# Patient Record
Sex: Male | Born: 1950 | Race: White | Hispanic: No | Marital: Married | Smoking: Never smoker
Health system: Southern US, Community
[De-identification: ages and names within clinical notes are randomized; demographics above are authoritative.]

## PROBLEM LIST (undated history)

## (undated) DIAGNOSIS — I10 Essential (primary) hypertension: Secondary | ICD-10-CM

## (undated) DIAGNOSIS — M1990 Unspecified osteoarthritis, unspecified site: Secondary | ICD-10-CM

## (undated) DIAGNOSIS — F329 Major depressive disorder, single episode, unspecified: Secondary | ICD-10-CM

## (undated) DIAGNOSIS — F419 Anxiety disorder, unspecified: Secondary | ICD-10-CM

## (undated) DIAGNOSIS — K219 Gastro-esophageal reflux disease without esophagitis: Secondary | ICD-10-CM

## (undated) DIAGNOSIS — E785 Hyperlipidemia, unspecified: Secondary | ICD-10-CM

## (undated) DIAGNOSIS — R519 Headache, unspecified: Secondary | ICD-10-CM

## (undated) DIAGNOSIS — F32A Depression, unspecified: Secondary | ICD-10-CM

## (undated) DIAGNOSIS — Z87442 Personal history of urinary calculi: Secondary | ICD-10-CM

## (undated) DIAGNOSIS — R51 Headache: Secondary | ICD-10-CM

## (undated) HISTORY — DX: Unspecified osteoarthritis, unspecified site: M19.90

## (undated) HISTORY — DX: Hyperlipidemia, unspecified: E78.5

## (undated) HISTORY — PX: TONSILLECTOMY: SUR1361

---

## 1996-11-24 HISTORY — PX: HERNIA REPAIR: SHX51

## 2004-07-24 ENCOUNTER — Emergency Department (HOSPITAL_COMMUNITY): Admission: EM | Admit: 2004-07-24 | Discharge: 2004-07-24 | Payer: Self-pay | Admitting: Emergency Medicine

## 2007-07-22 ENCOUNTER — Emergency Department (HOSPITAL_COMMUNITY): Admission: EM | Admit: 2007-07-22 | Discharge: 2007-07-22 | Payer: Self-pay | Admitting: Emergency Medicine

## 2009-07-31 ENCOUNTER — Emergency Department (HOSPITAL_COMMUNITY): Admission: EM | Admit: 2009-07-31 | Discharge: 2009-08-01 | Payer: Self-pay | Admitting: Emergency Medicine

## 2009-08-20 ENCOUNTER — Ambulatory Visit (HOSPITAL_COMMUNITY): Admission: RE | Admit: 2009-08-20 | Discharge: 2009-08-20 | Payer: Self-pay | Admitting: Urology

## 2011-02-28 LAB — DIFFERENTIAL
Basophils Absolute: 0 10*3/uL (ref 0.0–0.1)
Eosinophils Absolute: 0.2 10*3/uL (ref 0.0–0.7)
Lymphs Abs: 1.8 10*3/uL (ref 0.7–4.0)
Neutrophils Relative %: 63 % (ref 43–77)

## 2011-02-28 LAB — URINALYSIS, ROUTINE W REFLEX MICROSCOPIC
Bilirubin Urine: NEGATIVE
Specific Gravity, Urine: 1.022 (ref 1.005–1.030)
pH: 6.5 (ref 5.0–8.0)

## 2011-02-28 LAB — URINE CULTURE: Colony Count: NO GROWTH

## 2011-02-28 LAB — CBC
HCT: 42.4 % (ref 39.0–52.0)
MCHC: 34.1 g/dL (ref 30.0–36.0)
MCV: 93.6 fL (ref 78.0–100.0)
Platelets: 199 10*3/uL (ref 150–400)
WBC: 7.1 10*3/uL (ref 4.0–10.5)

## 2011-02-28 LAB — POCT I-STAT, CHEM 8
BUN: 28 mg/dL — ABNORMAL HIGH (ref 6–23)
Chloride: 102 mEq/L (ref 96–112)
Creatinine, Ser: 0.8 mg/dL (ref 0.4–1.5)
Glucose, Bld: 120 mg/dL — ABNORMAL HIGH (ref 70–99)
HCT: 42 % (ref 39.0–52.0)
Sodium: 139 mEq/L (ref 135–145)
TCO2: 33 mmol/L (ref 0–100)

## 2011-02-28 LAB — URINE MICROSCOPIC-ADD ON

## 2014-05-05 ENCOUNTER — Encounter (INDEPENDENT_AMBULATORY_CARE_PROVIDER_SITE_OTHER): Payer: BC Managed Care – PPO | Admitting: Ophthalmology

## 2014-05-05 DIAGNOSIS — H33309 Unspecified retinal break, unspecified eye: Secondary | ICD-10-CM

## 2014-05-05 DIAGNOSIS — H43819 Vitreous degeneration, unspecified eye: Secondary | ICD-10-CM

## 2014-05-05 DIAGNOSIS — I10 Essential (primary) hypertension: Secondary | ICD-10-CM

## 2014-05-05 DIAGNOSIS — H2510 Age-related nuclear cataract, unspecified eye: Secondary | ICD-10-CM

## 2014-05-05 DIAGNOSIS — H35039 Hypertensive retinopathy, unspecified eye: Secondary | ICD-10-CM

## 2014-05-17 ENCOUNTER — Ambulatory Visit (INDEPENDENT_AMBULATORY_CARE_PROVIDER_SITE_OTHER): Payer: BC Managed Care – PPO | Admitting: Ophthalmology

## 2014-05-17 DIAGNOSIS — H33309 Unspecified retinal break, unspecified eye: Secondary | ICD-10-CM

## 2014-09-12 ENCOUNTER — Encounter (INDEPENDENT_AMBULATORY_CARE_PROVIDER_SITE_OTHER): Payer: BC Managed Care – PPO | Admitting: Ophthalmology

## 2014-09-12 DIAGNOSIS — H35033 Hypertensive retinopathy, bilateral: Secondary | ICD-10-CM

## 2014-09-12 DIAGNOSIS — H35371 Puckering of macula, right eye: Secondary | ICD-10-CM

## 2014-09-12 DIAGNOSIS — H43813 Vitreous degeneration, bilateral: Secondary | ICD-10-CM

## 2014-09-12 DIAGNOSIS — I10 Essential (primary) hypertension: Secondary | ICD-10-CM

## 2014-09-12 DIAGNOSIS — H33301 Unspecified retinal break, right eye: Secondary | ICD-10-CM

## 2015-08-30 ENCOUNTER — Other Ambulatory Visit: Payer: Self-pay | Admitting: Urology

## 2015-09-05 ENCOUNTER — Encounter (HOSPITAL_COMMUNITY): Payer: Self-pay | Admitting: *Deleted

## 2015-09-06 ENCOUNTER — Ambulatory Visit (HOSPITAL_COMMUNITY)
Admission: RE | Admit: 2015-09-06 | Discharge: 2015-09-06 | Disposition: A | Discharge status: Home / Self Care | Payer: 59 | Source: Ambulatory Visit | Attending: Urology | Admitting: Urology

## 2015-09-06 ENCOUNTER — Encounter (HOSPITAL_COMMUNITY)
Admission: RE | Disposition: A | Discharge status: Home / Self Care | Payer: Self-pay | Source: Ambulatory Visit | Attending: Urology

## 2015-09-06 ENCOUNTER — Ambulatory Visit (HOSPITAL_COMMUNITY): Payer: 59

## 2015-09-06 ENCOUNTER — Encounter (HOSPITAL_COMMUNITY): Payer: Self-pay | Admitting: General Practice

## 2015-09-06 ENCOUNTER — Emergency Department (HOSPITAL_COMMUNITY): Payer: 59

## 2015-09-06 ENCOUNTER — Emergency Department (HOSPITAL_COMMUNITY)
Admission: EM | Admit: 2015-09-06 | Discharge: 2015-09-07 | Disposition: A | Discharge status: Home / Self Care | Payer: 59 | Attending: Emergency Medicine | Admitting: Emergency Medicine

## 2015-09-06 ENCOUNTER — Encounter (HOSPITAL_COMMUNITY): Payer: Self-pay | Admitting: Emergency Medicine

## 2015-09-06 DIAGNOSIS — K219 Gastro-esophageal reflux disease without esophagitis: Secondary | ICD-10-CM | POA: Insufficient documentation

## 2015-09-06 DIAGNOSIS — I10 Essential (primary) hypertension: Secondary | ICD-10-CM | POA: Diagnosis not present

## 2015-09-06 DIAGNOSIS — N3289 Other specified disorders of bladder: Secondary | ICD-10-CM | POA: Diagnosis not present

## 2015-09-06 DIAGNOSIS — F329 Major depressive disorder, single episode, unspecified: Secondary | ICD-10-CM | POA: Insufficient documentation

## 2015-09-06 DIAGNOSIS — R109 Unspecified abdominal pain: Secondary | ICD-10-CM | POA: Diagnosis not present

## 2015-09-06 DIAGNOSIS — R112 Nausea with vomiting, unspecified: Secondary | ICD-10-CM | POA: Diagnosis present

## 2015-09-06 DIAGNOSIS — Z79899 Other long term (current) drug therapy: Secondary | ICD-10-CM | POA: Diagnosis not present

## 2015-09-06 DIAGNOSIS — N201 Calculus of ureter: Secondary | ICD-10-CM

## 2015-09-06 HISTORY — DX: Personal history of urinary calculi: Z87.442

## 2015-09-06 HISTORY — DX: Essential (primary) hypertension: I10

## 2015-09-06 HISTORY — DX: Headache: R51

## 2015-09-06 HISTORY — DX: Headache, unspecified: R51.9

## 2015-09-06 HISTORY — DX: Gastro-esophageal reflux disease without esophagitis: K21.9

## 2015-09-06 HISTORY — DX: Anxiety disorder, unspecified: F41.9

## 2015-09-06 HISTORY — DX: Depression, unspecified: F32.A

## 2015-09-06 HISTORY — DX: Major depressive disorder, single episode, unspecified: F32.9

## 2015-09-06 LAB — URINALYSIS, ROUTINE W REFLEX MICROSCOPIC
Bilirubin Urine: NEGATIVE
Glucose, UA: NEGATIVE mg/dL
Ketones, ur: 15 mg/dL — AB
Leukocytes, UA: NEGATIVE
NITRITE: NEGATIVE
PROTEIN: NEGATIVE mg/dL
SPECIFIC GRAVITY, URINE: 1.023 (ref 1.005–1.030)
UROBILINOGEN UA: 0.2 mg/dL (ref 0.0–1.0)
pH: 5.5 (ref 5.0–8.0)

## 2015-09-06 LAB — CBC WITH DIFFERENTIAL/PLATELET
Basophils Absolute: 0 10*3/uL (ref 0.0–0.1)
Basophils Relative: 0 %
Eosinophils Absolute: 0 10*3/uL (ref 0.0–0.7)
Eosinophils Relative: 0 %
HEMATOCRIT: 43.9 % (ref 39.0–52.0)
HEMOGLOBIN: 15.2 g/dL (ref 13.0–17.0)
LYMPHS ABS: 0.9 10*3/uL (ref 0.7–4.0)
LYMPHS PCT: 6 %
MCH: 31.5 pg (ref 26.0–34.0)
MCHC: 34.6 g/dL (ref 30.0–36.0)
MCV: 90.9 fL (ref 78.0–100.0)
MONOS PCT: 3 %
Monocytes Absolute: 0.4 10*3/uL (ref 0.1–1.0)
NEUTROS PCT: 91 %
Neutro Abs: 12.3 10*3/uL — ABNORMAL HIGH (ref 1.7–7.7)
Platelets: 176 10*3/uL (ref 150–400)
RBC: 4.83 MIL/uL (ref 4.22–5.81)
RDW: 12.5 % (ref 11.5–15.5)
WBC: 13.6 10*3/uL — AB (ref 4.0–10.5)

## 2015-09-06 LAB — COMPREHENSIVE METABOLIC PANEL
ALBUMIN: 4.4 g/dL (ref 3.5–5.0)
ALK PHOS: 67 U/L (ref 38–126)
ALT: 28 U/L (ref 17–63)
ANION GAP: 8 (ref 5–15)
AST: 23 U/L (ref 15–41)
BUN: 18 mg/dL (ref 6–20)
CHLORIDE: 100 mmol/L — AB (ref 101–111)
CO2: 28 mmol/L (ref 22–32)
CREATININE: 0.9 mg/dL (ref 0.61–1.24)
Calcium: 9.1 mg/dL (ref 8.9–10.3)
GFR calc non Af Amer: >60 mL/min (ref >60)
GLUCOSE: 146 mg/dL — AB (ref 65–99)
Potassium: 4.1 mmol/L (ref 3.5–5.1)
SODIUM: 136 mmol/L (ref 135–145)
Total Bilirubin: 1.2 mg/dL (ref 0.3–1.2)
Total Protein: 7.3 g/dL (ref 6.5–8.1)

## 2015-09-06 LAB — LIPASE, BLOOD: Lipase: 29 U/L (ref 22–51)

## 2015-09-06 LAB — URINE MICROSCOPIC-ADD ON

## 2015-09-06 SURGERY — LITHOTRIPSY, ESWL
Anesthesia: LOCAL | Laterality: Left

## 2015-09-06 MED ORDER — KETOROLAC TROMETHAMINE 30 MG/ML IJ SOLN
30.0000 mg | Freq: Once | INTRAMUSCULAR | Status: AC
  Administered 2015-09-07: 30 mg via INTRAVENOUS
  Filled 2015-09-06: qty 1

## 2015-09-06 MED ORDER — ONDANSETRON HCL 4 MG/2ML IJ SOLN
4.0000 mg | Freq: Once | INTRAMUSCULAR | Status: AC | PRN
  Administered 2015-09-06: 4 mg via INTRAVENOUS
  Filled 2015-09-06: qty 2

## 2015-09-06 MED ORDER — HYDROMORPHONE HCL 1 MG/ML IJ SOLN
1.0000 mg | Freq: Once | INTRAMUSCULAR | Status: AC
  Administered 2015-09-06: 1 mg via INTRAVENOUS
  Filled 2015-09-06: qty 1

## 2015-09-06 MED ORDER — SODIUM CHLORIDE 0.9 % IV SOLN
INTRAVENOUS | Status: DC
  Administered 2015-09-06: 08:00:00 via INTRAVENOUS

## 2015-09-06 MED ORDER — DIAZEPAM 5 MG PO TABS
10.0000 mg | ORAL_TABLET | ORAL | Status: AC
  Administered 2015-09-06: 10 mg via ORAL
  Filled 2015-09-06: qty 2

## 2015-09-06 MED ORDER — FENTANYL CITRATE (PF) 100 MCG/2ML IJ SOLN
50.0000 ug | Freq: Once | INTRAMUSCULAR | Status: AC
  Administered 2015-09-06: 50 ug via INTRAVENOUS
  Filled 2015-09-06: qty 2

## 2015-09-06 MED ORDER — DIPHENHYDRAMINE HCL 25 MG PO CAPS
25.0000 mg | ORAL_CAPSULE | ORAL | Status: AC
  Administered 2015-09-06: 25 mg via ORAL
  Filled 2015-09-06: qty 1

## 2015-09-06 MED ORDER — CIPROFLOXACIN HCL 500 MG PO TABS
500.0000 mg | ORAL_TABLET | ORAL | Status: AC
  Administered 2015-09-06: 500 mg via ORAL
  Filled 2015-09-06: qty 1

## 2015-09-06 MED ORDER — SODIUM CHLORIDE 0.9 % IV BOLUS (SEPSIS)
1000.0000 mL | Freq: Once | INTRAVENOUS | Status: AC
  Administered 2015-09-06: 1000 mL via INTRAVENOUS

## 2015-09-06 NOTE — Discharge Instructions (Signed)
Kidney Stones °Kidney stones (urolithiasis) are deposits that form inside your kidneys. The intense pain is caused by the stone moving through the urinary tract. When the stone moves, the ureter goes into spasm around the stone. The stone is usually passed in the urine.  °CAUSES  °· A disorder that makes certain neck glands produce too much parathyroid hormone (primary hyperparathyroidism). °· A buildup of uric acid crystals, similar to gout in your joints. °· Narrowing (stricture) of the ureter. °· A kidney obstruction present at birth (congenital obstruction). °· Previous surgery on the kidney or ureters. °· Numerous kidney infections. °SYMPTOMS  °· Feeling sick to your stomach (nauseous). °· Throwing up (vomiting). °· Blood in the urine (hematuria). °· Pain that usually spreads (radiates) to the groin. °· Frequency or urgency of urination. °DIAGNOSIS  °· Taking a history and physical exam. °· Blood or urine tests. °· CT scan. °· Occasionally, an examination of the inside of the urinary bladder (cystoscopy) is performed. °TREATMENT  °· Observation. °· Increasing your fluid intake. °· Extracorporeal shock wave lithotripsy--This is a noninvasive procedure that uses shock waves to break up kidney stones. °· Surgery may be needed if you have severe pain or persistent obstruction. There are various surgical procedures. Most of the procedures are performed with the use of small instruments. Only small incisions are needed to accommodate these instruments, so recovery time is minimized. °The size, location, and chemical composition are all important variables that will determine the proper choice of action for you. Talk to your health care provider to better understand your situation so that you will minimize the risk of injury to yourself and your kidney.  °HOME CARE INSTRUCTIONS  °· Drink enough water and fluids to keep your urine clear or pale yellow. This will help you to pass the stone or stone fragments. °· Strain  all urine through the provided strainer. Keep all particulate matter and stones for your health care provider to see. The stone causing the pain may be as small as a grain of salt. It is very important to use the strainer each and every time you pass your urine. The collection of your stone will allow your health care provider to analyze it and verify that a stone has actually passed. The stone analysis will often identify what you can do to reduce the incidence of recurrences. °· Only take over-the-counter or prescription medicines for pain, discomfort, or fever as directed by your health care provider. °· Keep all follow-up visits as told by your health care provider. This is important. °· Get follow-up X-rays if required. The absence of pain does not always mean that the stone has passed. It may have only stopped moving. If the urine remains completely obstructed, it can cause loss of kidney function or even complete destruction of the kidney. It is your responsibility to make sure X-rays and follow-ups are completed. Ultrasounds of the kidney can show blockages and the status of the kidney. Ultrasounds are not associated with any radiation and can be performed easily in a matter of minutes. °· Make changes to your daily diet as told by your health care provider. You may be told to: °¨ Limit the amount of salt that you eat. °¨ Eat 5 or more servings of fruits and vegetables each day. °¨ Limit the amount of meat, poultry, fish, and eggs that you eat. °· Collect a 24-hour urine sample as told by your health care provider. You may need to collect another urine sample every 6-12   months. °SEEK MEDICAL CARE IF: °· You experience pain that is progressive and unresponsive to any pain medicine you have been prescribed. °SEEK IMMEDIATE MEDICAL CARE IF:  °· Pain cannot be controlled with the prescribed medicine. °· You have a fever or shaking chills. °· The severity or intensity of pain increases over 18 hours and is not  relieved by pain medicine. °· You develop a new onset of abdominal pain. °· You feel faint or pass out. °· You are unable to urinate. °  °This information is not intended to replace advice given to you by your health care provider. Make sure you discuss any questions you have with your health care provider. °  °Document Released: 11/10/2005 Document Revised: 08/01/2015 Document Reviewed: 04/13/2013 °Elsevier Interactive Patient Education ©2016 [employer] ° °

## 2015-09-06 NOTE — ED Notes (Addendum)
Pt has lithotripsy this morning, now having severe n/v. Pt actively vomiting in lobby.

## 2015-09-07 MED ORDER — ONDANSETRON 4 MG PO TBDP: 4.0000 mg | ORAL_TABLET | Freq: Three times a day (TID) | ORAL | Status: DC | PRN

## 2015-09-07 NOTE — ED Provider Notes (Signed)
CSN: [id number]     Arrival date & time 09/06/15  2108 History   First MD Initiated Contact with Patient 09/06/15 2147     Chief Complaint  Patient presents with  . Emesis  . Nausea     (Consider location/radiation/quality/duration/timing/severity/associated sxs/prior Treatment) HPI Comments: 64yo M w/ PMH including HTN, GERD, kidney stones, anxiety/depression who p/w vomiting and L side pain. Pt had lithotripsy earlier today for chronic left ureteral stone. Procedure was uncomplicated and he was stable when he went home. This evening at 6pm he began having severe L side into LLQ pain w/ multiple episodes of vomiting. He took hydrocodone w/ no relief. He continues to have dark urine; he has had intermittent hematuria with the known stone. No fevers, no chest pain or shortness of breath. He feels like he cannot urinate. He also endorses recent constipation. No diarrhea.   The history is provided by the patient.    Past Medical History  Diagnosis Date  . History of kidney stones 2010, 2006, 2002    cystoscopy with stone retrieval  . Hypertension   . Depression   . Anxiety   . GERD (gastroesophageal reflux disease)   . Headache     sinus headache   Past Surgical History  Procedure Laterality Date  . Hernia repair Bilateral 1998  . Tonsillectomy      childhood   History reviewed. No pertinent family history. Social History  Substance Use Topics  . Smoking status: Never Smoker   . Smokeless tobacco: None  . Alcohol Use: No    Review of Systems  10 Systems reviewed and are negative for acute change except as noted in the HPI.   Allergies  Amoxicillin  Home Medications   Prior to Admission medications   Medication Sig Start Date End Date Taking? Authorizing Provider  bisacodyl (DULCOLAX) 5 MG EC tablet Take 5 mg by mouth once.   Yes Historical Provider, MD  chlorpheniramine (CHLOR-TRIMETON) 4 MG tablet Take 4 mg by mouth every morning.   Yes Historical Provider, MD   fluticasone (FLONASE) 50 MCG/ACT nasal spray Place 2 sprays into both nostrils daily.   Yes Historical Provider, MD  lisinopril-hydrochlorothiazide (PRINZIDE,ZESTORETIC) 20-12.5 MG tablet Take 1 tablet by mouth every morning.   Yes Historical Provider, MD  oxyCODONE-acetaminophen (PERCOCET/ROXICET) 5-325 MG tablet Take 1 tablet by mouth every 4 (four) hours as needed for severe pain.   Yes Historical Provider, MD  PARoxetine (PAXIL) 20 MG tablet Take 20 mg by mouth every morning.   Yes Historical Provider, MD  pravastatin (PRAVACHOL) 40 MG tablet Take 40 mg by mouth every morning.   Yes Historical Provider, MD  tamsulosin (FLOMAX) 0.4 MG CAPS capsule Take 0.4 mg by mouth every morning.   Yes Historical Provider, MD  ondansetron (ZOFRAN ODT) 4 MG disintegrating tablet Take 1 tablet (4 mg total) by mouth every 8 (eight) hours as needed for nausea or vomiting. 09/07/15   Ambrose Finlandachel Morgan Noell Shular, MD   BP 119/74 mmHg  Pulse 100  Temp(Src) 98.3 F (36.8 C) (Oral)  Resp 18  SpO2 96% Physical Exam  Constitutional: He is oriented to person, place, and time. He appears well-developed and well-nourished. No distress.  Holding left side, sitting forward, in moderate distress due to pain  HENT:  Head: Normocephalic and atraumatic.  Moist mucous membranes  Eyes: Conjunctivae are normal. Pupils are equal, round, and reactive to light.  Neck: Neck supple.  Cardiovascular: Normal rate, regular rhythm and normal heart sounds.  No murmur heard. Pulmonary/Chest: Effort normal and breath sounds normal.  Abdominal: Soft. Bowel sounds are normal. He exhibits no distension.  Mild left periumbilical and LLQ tenderness, no rebound or guarding  Genitourinary:  No CVA tenderness  Musculoskeletal: He exhibits no edema.  Neurological: He is alert and oriented to person, place, and time.  Fluent speech  Skin: Skin is warm and dry.  Psychiatric: He has a normal mood and affect. Judgment normal.  distressed   Nursing note and vitals reviewed.   ED Course  Procedures (including critical care time) Labs Review Labs Reviewed  URINALYSIS, ROUTINE W REFLEX MICROSCOPIC (NOT AT [facility]) - Abnormal; Notable for the following:    APPearance CLOUDY (*)    Hgb urine dipstick LARGE (*)    Ketones, ur 15 (*)    All other components within normal limits  COMPREHENSIVE METABOLIC PANEL - Abnormal; Notable for the following:    Chloride 100 (*)    Glucose, Bld 146 (*)    All other components within normal limits  CBC WITH DIFFERENTIAL/PLATELET - Abnormal; Notable for the following:    WBC 13.6 (*)    Neutro Abs 12.3 (*)    All other components within normal limits  LIPASE, BLOOD  URINE MICROSCOPIC-ADD ON    Imaging Review No results found. I have personally reviewed and evaluated these images and lab results as part of my medical decision-making.   EKG Interpretation None     Medications  ondansetron (ZOFRAN) injection 4 mg (4 mg Intravenous Given 09/06/15 2206)  fentaNYL (SUBLIMAZE) injection 50 mcg (50 mcg Intravenous Given 09/06/15 2206)  sodium chloride 0.9 % bolus 1,000 mL (0 mLs Intravenous Stopped 09/06/15 2311)  HYDROmorphone (DILAUDID) injection 1 mg (1 mg Intravenous Given 09/06/15 2209)  HYDROmorphone (DILAUDID) injection 1 mg (1 mg Intravenous Given 09/06/15 2309)  ketorolac (TORADOL) 30 MG/ML injection 30 mg (30 mg Intravenous Given 09/07/15 0004)    MDM   Final diagnoses:  Left flank pain  Bladder spasm   64yo M who p/w severe L side pain and vomiting after lithotripsy for L ureteral stone earlier today. Pt in moderate distress at presentation. Mild LLQ tenderness but no peritonitis on exam. VS stable. Obtained labs which showed no evidence of infection on UA and normal Cr at 0.9. KUB showed no evidence of stone which was identified before lithotripsy. Gave pt zofran, dilaudid, and later toradol for pain. Spoke with urologist on call and appreciate his assistance. He stated that  these symptoms occasionally happen after break-up of a large stone and he recommended pain and nausea control w/ close f/u with clinic. On reexamination, pt is comfortable and is tolerating water. He has been able to urinate in ED. Provided w/ percocet and supportive care instructions. Return precautions reviewed and pt instructed to call clinic in the morning if any issues. Pt comfortable with plan and discharged in satisfactory condition.    Laurence Spates, MD 09/09/15 2111

## 2016-07-02 ENCOUNTER — Ambulatory Visit: Payer: 59 | Admitting: Neurology

## 2016-08-04 ENCOUNTER — Encounter: Payer: Self-pay | Admitting: Neurology

## 2016-08-04 ENCOUNTER — Ambulatory Visit (INDEPENDENT_AMBULATORY_CARE_PROVIDER_SITE_OTHER): Payer: 59 | Admitting: Neurology

## 2016-08-04 VITALS — BP 130/77 | HR 76 | Ht 69.5 in | Wt 184.8 lb

## 2016-08-04 DIAGNOSIS — M545 Low back pain, unspecified: Secondary | ICD-10-CM | POA: Insufficient documentation

## 2016-08-04 DIAGNOSIS — M25559 Pain in unspecified hip: Secondary | ICD-10-CM

## 2016-08-04 DIAGNOSIS — E538 Deficiency of other specified B group vitamins: Secondary | ICD-10-CM

## 2016-08-04 DIAGNOSIS — M5441 Lumbago with sciatica, right side: Secondary | ICD-10-CM | POA: Diagnosis not present

## 2016-08-04 DIAGNOSIS — R29898 Other symptoms and signs involving the musculoskeletal system: Secondary | ICD-10-CM | POA: Diagnosis not present

## 2016-08-04 DIAGNOSIS — M5442 Lumbago with sciatica, left side: Secondary | ICD-10-CM | POA: Diagnosis not present

## 2016-08-04 DIAGNOSIS — M6281 Muscle weakness (generalized): Secondary | ICD-10-CM

## 2016-08-04 DIAGNOSIS — M4800 Spinal stenosis, site unspecified: Secondary | ICD-10-CM | POA: Diagnosis not present

## 2016-08-04 DIAGNOSIS — G629 Polyneuropathy, unspecified: Secondary | ICD-10-CM | POA: Diagnosis not present

## 2016-08-04 DIAGNOSIS — W57XXXA Bitten or stung by nonvenomous insect and other nonvenomous arthropods, initial encounter: Secondary | ICD-10-CM

## 2016-08-04 DIAGNOSIS — M5416 Radiculopathy, lumbar region: Secondary | ICD-10-CM

## 2016-08-04 NOTE — Progress Notes (Signed)
[facility]    Provider:  Dr Lucia Gaskins Referring Provider: Jolene Provost, MD Primary Care Physician:  Jolene Provost, MD  CC:  Leg weakness  HPI:  Ricardo Carter is a 65 y.o. male here as a referral from Dr. William Hamburger for weakness in the legs. Past medical history hypertension, high cholesterol, anxiety, impaired Glucose, weakness of both lower extremities hypercholesterolemia, recurrent major depression, hip pain, hip pain started a year ago and the last 6 months more leg weakness after being in the process of restoring a home and he had pulmonary issues and bronchitis in the spring and march and at the same time he noticed leg weakness. The right hips is worse than the left. He is favoring the elevator at work. He does have low back pain all his life an injury when he was 20 and had bad muscle spasms and he learned over the years to [facility] that, his mother has spinal stenosis. He has radiating pain down the sides of his hips through his calfs down to the toes. Standing for long periods makes it worse. Legs get heavy. They have not done xrays of the hips. He has not mentioned the hip pain to Dr. William Hamburger which is why no xrays done yet. Weakness and heaviness int he legs when he is sitting or laying down, helps to move around if sitting for a long time, noticed numbness in the buttocks and the feet as well. He is having difficulty getting out of low seats. No weakness in the arms when holding overhead. He has some neck pain and tightness for years from bad posture and some radiation into the right arm. Leg weakness is not getting any better.Has radicular symptoms into the bilateral legs.  He likes to bend over the cart in the grocery store when walking. Right worse than the left. He has cold feet for years as well. He has neck pain and right-sided radicular symptoms. No other associated symptoms or modifying factors. Symptoms in the legs continuous.   Reviewed notes, labs and imaging from  outside physicians, which showed:  CT cervical spine: Findings:  The alignment of the cervical spine including the cervicothoracic junction is normal.  Negative for fracture or dislocation.  Small amount of density in the right apex could represent atelectasis.  Patient has multilevel degenerative changes with disc space loss at C3-C4 with  posterior spurring at this level.  Prevertebral soft tissues are normal.  Small scattered lymph nodes throughout the neck.  IMPRESSION: Degenerative changes of the cervical spine without acute bone abnormality.  BUN 18, creatinine 0.9 09/06/2015  Review of Systems: Patient complains of symptoms per HPI as well as the following symptoms: Fatigue, snoring, ringing in ears, joint pain, allergies, headache, numbness, weakness, snoring, anxiety, not in sleep, decreased energy, allergies. Pertinent negatives per HPI. All others negative.   Social History   Social History  . Marital status: Married    Spouse name: Lurena Joiner  . Number of children: 3  . Years of education: Associates   Occupational History  . [employer] and [employer]     Social History Main Topics  . Smoking status: Never Smoker  . Smokeless tobacco: Never Used  . Alcohol use No  . Drug use: No  . Sexual activity: Not on file   Other Topics Concern  . Not on file   Social History Narrative   Lives with wife   Caffeine use: 3 cups per day    Family History  Problem  Relation Age of Onset  . Hypertension Mother   . Diabetes Sister     Past Medical History:  Diagnosis Date  . Anxiety   . Depression   . GERD (gastroesophageal reflux disease)   . Headache    sinus headache  . History of kidney stones 2010, 2006, 2002   cystoscopy with stone retrieval  . Hypertension     Past Surgical History:  Procedure Laterality Date  . HERNIA REPAIR Bilateral 1998  . TONSILLECTOMY     childhood    Current Outpatient Prescriptions  Medication Sig Dispense Refill  . fluticasone  (FLONASE) 50 MCG/ACT nasal spray Place 2 sprays into both nostrils daily.    Marland Kitchen lisinopril-hydrochlorothiazide (PRINZIDE,ZESTORETIC) 20-12.5 MG tablet Take 1 tablet by mouth every morning.    . loratadine (CLARITIN) 10 MG tablet Take 10 mg by mouth daily.    Marland Kitchen PARoxetine (PAXIL) 20 MG tablet Take 20 mg by mouth every morning.    . pravastatin (PRAVACHOL) 40 MG tablet Take 40 mg by mouth every morning.    . tamsulosin (FLOMAX) 0.4 MG CAPS capsule Take 0.4 mg by mouth every morning.     No current facility-administered medications for this visit.     Allergies as of 08/04/2016 - Review Complete 08/04/2016  Allergen Reaction Noted  . Amoxicillin Nausea And Vomiting 08/31/2015    Vitals: BP 130/77 (BP Location: Left Arm, Patient Position: Sitting, Cuff Size: Normal)   Pulse 76   Ht 5' 9.5" (1.765 m)   Wt 184 lb 12.8 oz (83.8 kg)   BMI 26.90 kg/m  Last Weight:  Wt Readings from Last 1 Encounters:  08/04/16 184 lb 12.8 oz (83.8 kg)   Last Height:   Ht Readings from Last 1 Encounters:  08/04/16 5' 9.5" (1.765 m)    Physical exam: Exam: Gen: NAD                 CV: RRR, no MRG. No Carotid Bruits. No peripheral edema, warm, nontender Eyes: Conjunctivae clear without exudates or hemorrhage  Neuro: Detailed Neurologic Exam  Speech:    Speech is normal; fluent and spontaneous with normal comprehension.  Cognition:    The patient is oriented to person, place, and time;     recent and remote memory intact;     language fluent;     normal attention, concentration,     fund of knowledge Cranial Nerves:    The pupils are equal, round, and reactive to light. The fundi are normal and spontaneous venous pulsations are present. Visual fields are full to finger confrontation. Extraocular movements are intact. Trigeminal sensation is intact and the muscles of mastication are normal. The face is symmetric. The palate elevates in the midline. Hearing intact. Voice is normal. Shoulder shrug is  normal. The tongue has normal motion without fasciculations.   Coordination:    Normal finger to nose and heel to shin. Normal rapid alternating movements.   Gait:    Heel-toe and tandem gait are normal.   Motor Observation:    No asymmetry, no atrophy, and no involuntary movements noted. Tone:    Normal muscle tone.    Posture:    Posture is normal. normal erect    Strength:    Strength is V/V in the upper and lower limbs.      Sensation: intact to LT, pin prick, vibration, proprioception     Reflex Exam:  DTR's:    Deep tendon reflexes in the upper and lower extremities are  normal bilaterally.   Toes:    The toes are downgoing bilaterally.   Clonus:    Clonus is absent.      Assessment/Plan:  65 year old with hip and back pain and leg weakness.   labwork today including B12, CK, Lyme Low back pain and leg weakness: Emg/ncs bilateral lowers and right arm.  Will also emg the right arm due to his neck pain and radicular arm pain. MRI lumbar spine  Hip pain: xrays of the hips.  PT for low back pain and hip pain , strengthening, stretching, massage manual therapy, .   Cc: Dr. Neysa HotterHaimes  Beatriz Settles, MD  [facility] [address] [place], [place] [id number]  Phone [phone] Fax [phone]

## 2016-08-04 NOTE — Patient Instructions (Signed)
Remember to drink plenty of fluid, eat healthy meals and do not skip any meals. Try to eat protein with a every meal and eat a healthy snack such as fruit or nuts in between meals. Try to keep a regular sleep-wake schedule and try to exercise daily, particularly in the form of walking, 20-30 minutes a day, if you can.   As far as diagnostic testing: Labwork, emg/ncs, mri lumbar spine and physical therapy  I would like to see you back for emg/ncx, sooner if we need to. Please call us with any interim questions, concerns, problems, updates or refill requests.   Our phone number is [phone]. We also have an after hours call service for urgent matters and there is a physician on-call for urgent questions. For any emergencies you know to call 911 or go to the nearest emergency room

## 2016-08-05 LAB — CK: Total CK: 165 U/L (ref 24–204)

## 2016-08-05 LAB — VITAMIN B12: Vitamin B-12: 283 pg/mL (ref 211–946)

## 2016-08-05 LAB — B. BURGDORFI ANTIBODIES: Lyme IgG/IgM Ab: <0.91 {ISR} (ref 0.00–0.90)

## 2016-08-06 ENCOUNTER — Telehealth: Payer: Self-pay | Admitting: *Deleted

## 2016-08-06 NOTE — Telephone Encounter (Signed)
-----   Message from Anson FretAntonia B Ahern, MD sent at 08/05/2016  4:50 PM EDT ----- Labs normal thanks

## 2016-08-06 NOTE — Telephone Encounter (Signed)
Called and spoke to patient about normal labs per Dr Ahern. Pt verbalized understanding.  

## 2016-08-14 ENCOUNTER — Other Ambulatory Visit: Payer: Self-pay | Admitting: Otolaryngology

## 2016-09-03 ENCOUNTER — Other Ambulatory Visit: Payer: 59

## 2016-10-09 ENCOUNTER — Encounter: Payer: 59 | Admitting: Neurology

## 2017-08-24 ENCOUNTER — Other Ambulatory Visit: Payer: Self-pay | Admitting: Urology

## 2019-05-16 ENCOUNTER — Emergency Department (HOSPITAL_COMMUNITY): Payer: BC Managed Care – PPO

## 2019-05-16 ENCOUNTER — Emergency Department (HOSPITAL_COMMUNITY)
Admission: EM | Admit: 2019-05-16 | Discharge: 2019-05-16 | Disposition: A | Discharge status: Home / Self Care | Payer: BC Managed Care – PPO | Attending: Emergency Medicine | Admitting: Emergency Medicine

## 2019-05-16 ENCOUNTER — Other Ambulatory Visit: Payer: Self-pay

## 2019-05-16 ENCOUNTER — Encounter (HOSPITAL_COMMUNITY): Payer: Self-pay | Admitting: Emergency Medicine

## 2019-05-16 DIAGNOSIS — K219 Gastro-esophageal reflux disease without esophagitis: Secondary | ICD-10-CM | POA: Diagnosis not present

## 2019-05-16 DIAGNOSIS — Z79899 Other long term (current) drug therapy: Secondary | ICD-10-CM | POA: Diagnosis not present

## 2019-05-16 DIAGNOSIS — R0789 Other chest pain: Secondary | ICD-10-CM | POA: Diagnosis present

## 2019-05-16 DIAGNOSIS — I10 Essential (primary) hypertension: Secondary | ICD-10-CM | POA: Insufficient documentation

## 2019-05-16 LAB — CBC
HCT: 45.9 % (ref 39.0–52.0)
Hemoglobin: 15.1 g/dL (ref 13.0–17.0)
MCH: 30.8 pg (ref 26.0–34.0)
MCHC: 32.9 g/dL (ref 30.0–36.0)
MCV: 93.7 fL (ref 80.0–100.0)
Platelets: 196 10*3/uL (ref 150–400)
RBC: 4.9 MIL/uL (ref 4.22–5.81)
RDW: 12.5 % (ref 11.5–15.5)
WBC: 8.6 10*3/uL (ref 4.0–10.5)
nRBC: 0 % (ref 0.0–0.2)

## 2019-05-16 LAB — BASIC METABOLIC PANEL
Anion gap: 10 (ref 5–15)
BUN: 20 mg/dL (ref 8–23)
CO2: 23 mmol/L (ref 22–32)
Calcium: 9.5 mg/dL (ref 8.9–10.3)
Chloride: 107 mmol/L (ref 98–111)
Creatinine, Ser: 0.83 mg/dL (ref 0.61–1.24)
GFR calc Af Amer: >60 mL/min (ref >60)
GFR calc non Af Amer: >60 mL/min (ref >60)
Glucose, Bld: 101 mg/dL — ABNORMAL HIGH (ref 70–99)
Potassium: 3.4 mmol/L — ABNORMAL LOW (ref 3.5–5.1)
Sodium: 140 mmol/L (ref 135–145)

## 2019-05-16 LAB — TROPONIN I
Troponin I: <0.03 ng/mL (ref <0.03)
Troponin I: <0.03 ng/mL (ref <0.03)

## 2019-05-16 MED ORDER — OMEPRAZOLE 20 MG PO CPDR: 20.0000 mg | DELAYED_RELEASE_CAPSULE | Freq: Every day | ORAL | 0 refills | Status: AC

## 2019-05-16 MED ORDER — SODIUM CHLORIDE 0.9% FLUSH: 3.0000 mL | Freq: Once | INTRAVENOUS | Status: DC

## 2019-05-16 MED ORDER — POTASSIUM CHLORIDE CRYS ER 20 MEQ PO TBCR
20.0000 meq | EXTENDED_RELEASE_TABLET | Freq: Once | ORAL | Status: AC
  Administered 2019-05-16: 18:00:00 20 meq via ORAL
  Filled 2019-05-16: qty 1

## 2019-05-16 NOTE — ED Provider Notes (Signed)
[facility]ENC[facility] Provider Note   CSN: [id number] Arrival date & time: 05/16/19  1204    History   Chief Complaint Chief Complaint  Patient presents with  . Chest Pain    HPI Ricardo Carter is a 68 y.o. male.     HPI  68 year old male presents with chest pain.  For the last couple days he is been feeling like he is having increased acid reflux.  He typically takes famotidine.  This morning it was worse and he had to sit up and lay in a chair for bed.  Ended up taking 1 of his wife's omeprazole this morning.  At around 11 AM the pain seemed to go away.  He is been pain-free since.  No shortness of breath, vomiting.  He had concomitant pain in his back, all of this feels like prior acid reflux but stronger.  No radiation of the pain.  No severe or sharp pain.  Felt dull.  Past Medical History:  Diagnosis Date  . Anxiety   . Depression   . GERD (gastroesophageal reflux disease)   . Headache    sinus headache  . History of kidney stones 2010, 2006, 2002   cystoscopy with stone retrieval  . Hypertension     Patient Active Problem List   Diagnosis Date Noted  . Lumbar radiculopathy 08/04/2016  . Low back pain 08/04/2016  . Leg weakness 08/04/2016    Past Surgical History:  Procedure Laterality Date  . HERNIA REPAIR Bilateral 1998  . TONSILLECTOMY     childhood        Home Medications    Prior to Admission medications   Medication Sig Start Date End Date Taking? Authorizing Provider  fluticasone (FLONASE) 50 MCG/ACT nasal spray Place 2 sprays into both nostrils daily.    [provider]  lisinopril-hydrochlorothiazide (PRINZIDE,ZESTORETIC) 20-12.5 MG tablet Take 1 tablet by mouth every morning.    [provider]  loratadine (CLARITIN) 10 MG tablet Take 10 mg by mouth daily.    [provider]  omeprazole (PRILOSEC) 20 MG capsule Take 1 capsule (20 mg total) by mouth daily. 05/16/19   Pricilla LovelessGoldston, Eesa Justiss, MD   PARoxetine (PAXIL) 20 MG tablet Take 20 mg by mouth every morning.    [provider]  pravastatin (PRAVACHOL) 40 MG tablet Take 40 mg by mouth every morning.    [provider]  tamsulosin (FLOMAX) 0.4 MG CAPS capsule Take 0.4 mg by mouth every morning.    [provider]    Family History Family History  Problem Relation Age of Onset  . Hypertension Mother   . Diabetes Sister     Social History Social History   Tobacco Use  . Smoking status: Never Smoker  . Smokeless tobacco: Never Used  Substance Use Topics  . Alcohol use: No  . Drug use: No     Allergies   Amoxicillin   Review of Systems Review of Systems  Respiratory: Negative for shortness of breath.   Cardiovascular: Positive for chest pain.  Gastrointestinal: Negative for abdominal pain and vomiting.  Musculoskeletal: Positive for back pain.  All other systems reviewed and are negative.    Physical Exam Updated Vital Signs BP 137/79   Pulse 76   Temp 98.3 F (36.8 C) (Oral)   Resp 15   Ht 5\' 9"  (1.753 m)   Wt 83.9 kg   SpO2 94%   BMI 27.32 kg/m   Physical Exam Vitals  signs and nursing note reviewed.  Constitutional:      Appearance: He is well-developed.  HENT:     Head: Normocephalic and atraumatic.     Right Ear: External ear normal.     Left Ear: External ear normal.     Nose: Nose normal.  Eyes:     General:        Right eye: No discharge.        Left eye: No discharge.  Neck:     Musculoskeletal: Neck supple.  Cardiovascular:     Rate and Rhythm: Normal rate and regular rhythm.     Heart sounds: Normal heart sounds.  Pulmonary:     Effort: Pulmonary effort is normal.     Breath sounds: Normal breath sounds.  Abdominal:     Palpations: Abdomen is soft.     Tenderness: There is no abdominal tenderness.  Skin:    General: Skin is warm and dry.  Neurological:     Mental Status: He is alert.  Psychiatric:        Mood and Affect: Mood is not anxious.       ED Treatments / Results  Labs (all labs ordered are listed, but only abnormal results are displayed) Labs Reviewed  BASIC METABOLIC PANEL - Abnormal; Notable for the following components:      Result Value   Potassium 3.4 (*)    Glucose, Bld 101 (*)    All other components within normal limits  CBC  TROPONIN I  TROPONIN I    EKG EKG Interpretation  Date/Time:  Monday May 16 2019 12:18:12 EDT Ventricular Rate:  91 PR Interval:  156 QRS Duration: 110 QT Interval:  380 QTC Calculation: 467 R Axis:   -52 Text Interpretation:  Normal sinus rhythm Incomplete right bundle branch block Left anterior fascicular block Abnormal ECG no significant change since 2005 Confirmed by Pricilla LovelessGoldston, Yevonne Yokum [phone]([id number]) on 05/16/2019 4:06:00 PM   EKG Interpretation  Date/Time:  Monday May 16 2019 18:08:53 EDT Ventricular Rate:  77 PR Interval:  156 QRS Duration: 110 QT Interval:  407 QTC Calculation: 461 R Axis:   -36 Text Interpretation:  Sinus rhythm Incomplete RBBB and LAFB Abnormal R-wave progression, early transition no significant change since earlier in the day Confirmed by Pricilla LovelessGoldston, Diandra Cimini [phone]([id number]) on 05/16/2019 6:15:17 PM       Radiology Dg Chest 2 View  Result Date: 05/16/2019 CLINICAL DATA:  Chest and back pain. EXAM: CHEST - 2 VIEW COMPARISON:  06/22/2016 FINDINGS: Cardiomediastinal silhouette is normal. Mediastinal contours appear intact. There is no evidence of focal airspace consolidation, pleural effusion or pneumothorax. Osseous structures are without acute abnormality. Soft tissues are grossly normal. IMPRESSION: No active cardiopulmonary disease. Electronically Signed   By: Ted Mcalpineobrinka  Dimitrova M.D.   On: 05/16/2019 12:55    Procedures Procedures (including critical care time)  Medications Ordered in ED Medications  sodium chloride flush (NS) 0.9 % injection 3 mL (has no administration in time range)  potassium chloride SA (K-DUR) CR tablet 20 mEq (20 mEq Oral Given 05/16/19  1809)     Initial Impression / Assessment and Plan / ED Course  I have reviewed the triage vital signs and the nursing notes.  Pertinent labs & imaging results that were available during my care of the patient were reviewed by me and considered in my medical decision making (see chart for details).        Patient's lab work is overall unremarkable including 2 troponins that are negative.  My suspicion is this is GERD as he has had this in the past.  There is no abdominal tenderness.  He did not have any pain radiating to his back and is not hypertensive so I think dissection is pretty unlikely.  I highly doubt PE.  He does have hypertension and an age over 32 so he has some risk factors for coronary disease but I do not think this is ACS.  I discussed that while we have ruled out MI currently, I cannot rule out all coronary disease and he needs to follow-up closely with his PCP.  We discussed strict return precautions.  I will prescribe omeprazole in addition to his famotidine.  Final Clinical Impressions(s) / ED Diagnoses   Final diagnoses:  Atypical chest pain  Gastroesophageal reflux disease, esophagitis presence not specified    ED Discharge Orders         Ordered    omeprazole (PRILOSEC) 20 MG capsule  Daily     05/16/19 1816           Sherwood Gambler, MD 05/16/19 1818

## 2019-05-16 NOTE — ED Triage Notes (Signed)
Patient reports waking up during the night feeling like he had acid reflux, took a pepcid but reports he felt no better once he woke up this am. States he took one of his wife's omeprazole and then reports around 11 am this morning pain had become very uncomfortable radiating to his back. States pain has now eased off and he has minimal pain at this time. Pt a/ox resp e/u, nad. Denies sob, cough, fever chills or body aches.

## 2019-05-16 NOTE — Discharge Instructions (Addendum)
If you develop recurrent, continued, or worsening chest pain, shortness of breath, fever, vomiting, abdominal or back pain, or any other new/concerning symptoms then return to the ER for evaluation.  

## 2019-10-07 ENCOUNTER — Other Ambulatory Visit: Payer: Self-pay

## 2019-10-07 DIAGNOSIS — Z20822 Contact with and (suspected) exposure to covid-19: Secondary | ICD-10-CM

## 2019-10-11 LAB — NOVEL CORONAVIRUS, NAA: SARS-CoV-2, NAA: NOT DETECTED

## 2020-12-12 ENCOUNTER — Other Ambulatory Visit: Payer: Self-pay | Admitting: Internal Medicine

## 2020-12-12 DIAGNOSIS — U071 COVID-19: Secondary | ICD-10-CM

## 2020-12-13 ENCOUNTER — Other Ambulatory Visit: Payer: Self-pay | Admitting: Internal Medicine

## 2020-12-13 DIAGNOSIS — U071 COVID-19: Secondary | ICD-10-CM

## 2021-01-15 ENCOUNTER — Encounter: Payer: Self-pay | Admitting: Gastroenterology

## 2021-01-31 ENCOUNTER — Ambulatory Visit (INDEPENDENT_AMBULATORY_CARE_PROVIDER_SITE_OTHER): Payer: 59 | Admitting: Gastroenterology

## 2021-01-31 ENCOUNTER — Encounter: Payer: Self-pay | Admitting: Gastroenterology

## 2021-01-31 VITALS — BP 130/80 | HR 88 | Ht 68.31 in | Wt 186.5 lb

## 2021-01-31 DIAGNOSIS — R1310 Dysphagia, unspecified: Secondary | ICD-10-CM | POA: Diagnosis not present

## 2021-01-31 NOTE — Progress Notes (Signed)
Reviewed.  Kimberly L. Beavers, MD, MPH  

## 2021-01-31 NOTE — Progress Notes (Signed)
01/31/2021 Ricardo Carter [id number] 1951/06/02   HISTORY OF PRESENT ILLNESS: This is a pleasant 70 year old male who is new to our office.  He was referred here by his PCP, Dr. Cyndia Bent, for evaluation of esophageal dysphagia.  He tells me that he has been having episodes of food getting stuck in his throat intermittently for about maybe the past year or so.  He says it usually occurs about once a month and he has not been able to identify any particular triggers.  Usually when something gets stuck it will usually pass within at least about 5 minutes or so.  He says he usually take some sips of coffee, water, etc. to help it pass.  He takes omeprazole 20 mg daily, which he says controls his acid reflux well.  He denies any abdominal pain.  He denies any weight loss.  Colonoscopy was in February 2015 by Dr. Conley Rolls at which time he was found to have sigmoid diverticulosis and grade 1 internal hemorrhoids with an excellent prep.  Repeat was recommended a 10-year interval (in CareEverywhere).   Past Medical History:  Diagnosis Date  . Anxiety   . Arthritis   . Depression   . GERD (gastroesophageal reflux disease)   . Headache    sinus headache  . History of kidney stones 2010, 2006, 2002   cystoscopy with stone retrieval  . HLD (hyperlipidemia)   . Hypertension    Past Surgical History:  Procedure Laterality Date  . INGUINAL HERNIA REPAIR Bilateral 1998  . TONSILLECTOMY     childhood    reports that he has never smoked. He has never used smokeless tobacco. He reports that he does not drink alcohol and does not use drugs. family history includes Diabetes in his sister; Heart disease in his brother; Hypertension in his mother and sister; Kidney Stones in his daughter and mother. Allergies  Allergen Reactions  . Amoxicillin Nausea And Vomiting          Outpatient Encounter Medications as of 01/31/2021  Medication Sig  . fluticasone (FLONASE) 50 MCG/ACT nasal spray Place 2 sprays  into both nostrils daily.  Marland Kitchen lisinopril-hydrochlorothiazide (PRINZIDE,ZESTORETIC) 20-12.5 MG tablet Take 1 tablet by mouth every morning.  . loratadine (CLARITIN) 10 MG tablet Take 10 mg by mouth daily.  Marland Kitchen omeprazole (PRILOSEC) 20 MG capsule Take 1 capsule (20 mg total) by mouth daily.  Marland Kitchen PARoxetine (PAXIL) 20 MG tablet Take 20 mg by mouth every morning.  . rosuvastatin (CRESTOR) 20 MG tablet Take 20 mg by mouth at bedtime.  . tamsulosin (FLOMAX) 0.4 MG CAPS capsule Take 0.4 mg by mouth every morning.  . TOVIAZ 8 MG TB24 tablet Take 8 mg by mouth daily.  . [DISCONTINUED] pravastatin (PRAVACHOL) 40 MG tablet Take 40 mg by mouth every morning.   No facility-administered encounter medications on file as of 01/31/2021.     REVIEW OF SYSTEMS  : All other systems reviewed and negative except where noted in the History of Present Illness.   PHYSICAL EXAM: BP 130/80 (BP Location: Left Arm, Patient Position: Sitting, Cuff Size: Normal)   Pulse 88   Ht 5' 8.31" (1.735 m) Comment: height measured without shoes  Wt 186 lb 8 oz (84.6 kg)   BMI 28.10 kg/m  General: Well developed white male in no acute distress Head: Normocephalic and atraumatic Eyes:  Sclerae anicteric, conjunctiva pink. Ears: Normal auditory acuity Lungs: Clear throughout to auscultation; no W/R/R. Heart: Regular rate and rhythm; no M/R/G. Abdomen:  Soft, non-distended.  BS present.  Non-tender. Musculoskeletal: Symmetrical with no gross deformities  Skin: No lesions on visible extremities Extremities: No edema  Neurological: Alert oriented x 4, grossly non-focal Psychological:  Alert and cooperative. Normal mood and affect  ASSESSMENT AND PLAN: *Very intermittent/sporadic solid food dysphagia: Occurs about once a month.  It usually passes within 5 minutes or so with the assistance of drinking some water, coffee, etc.  Acid reflux well-controlled on omeprazole 20 mg daily.  No red flag symptoms including weight loss.  We will  start with an esophagram.  This honestly sounds more like esophageal dysmotility rather than a fixed structural issue such as a stricture, mass, etc.   CC:  Eartha Inch, MD

## 2021-01-31 NOTE — Patient Instructions (Signed)
If you are age 70 or older, your body mass index should be between 23-30. Your Body mass index is 28.1 kg/m. If this is out of the aforementioned range listed, please consider follow up with your Primary Care Provider.  If you are age 94 or younger, your body mass index should be between 19-25. Your Body mass index is 28.1 kg/m. If this is out of the aformentioned range listed, please consider follow up with your Primary Care Provider.   IMAGING:  . You will be contacted by [facility] Scheduling (Your caller ID will indicate phone # [phone]) within the next business 2 days to schedule your Barium Esophagram. If you have not heard from them within 2 business days, please call [facility] Scheduling at [phone] to follow up on the status of your appointment.

## 2021-02-12 ENCOUNTER — Other Ambulatory Visit: Payer: Self-pay | Admitting: Gastroenterology

## 2021-02-12 ENCOUNTER — Other Ambulatory Visit: Payer: Self-pay

## 2021-02-12 ENCOUNTER — Ambulatory Visit (HOSPITAL_COMMUNITY)
Admission: RE | Admit: 2021-02-12 | Discharge: 2021-02-12 | Disposition: A | Discharge status: Home / Self Care | Payer: 59 | Source: Ambulatory Visit | Attending: Gastroenterology | Admitting: Gastroenterology

## 2021-02-12 DIAGNOSIS — R1310 Dysphagia, unspecified: Secondary | ICD-10-CM | POA: Insufficient documentation

## 2021-02-27 ENCOUNTER — Ambulatory Visit: Payer: BC Managed Care – PPO | Admitting: Gastroenterology

## 2022-01-11 IMAGING — RF DG ESOPHAGUS
7 of 9 series · 16 of 24 positions shown · non-contrast
Comparison: None.

CLINICAL DATA: Dysphasia

EXAM:
ESOPHOGRAM / BARIUM SWALLOW / BARIUM TABLET STUDY
TECHNIQUE: Combined double contrast and single contrast examination performed
using effervescent crystals, thick barium liquid, and thin barium
liquid. The patient was observed with fluoroscopy swallowing a 13 mm
barium sulphate tablet.
FLUOROSCOPY TIME:  Fluoroscopy Time:  1 minutes 36 seconds
Radiation Exposure Index (if provided by the fluoroscopic device):
19.8 mGy
Number of Acquired Spot Images: 0

[Series 1: cp_standard · 0.25mm/px · 1 of 1 slices shown (1 of 7)]
[im 1/1]
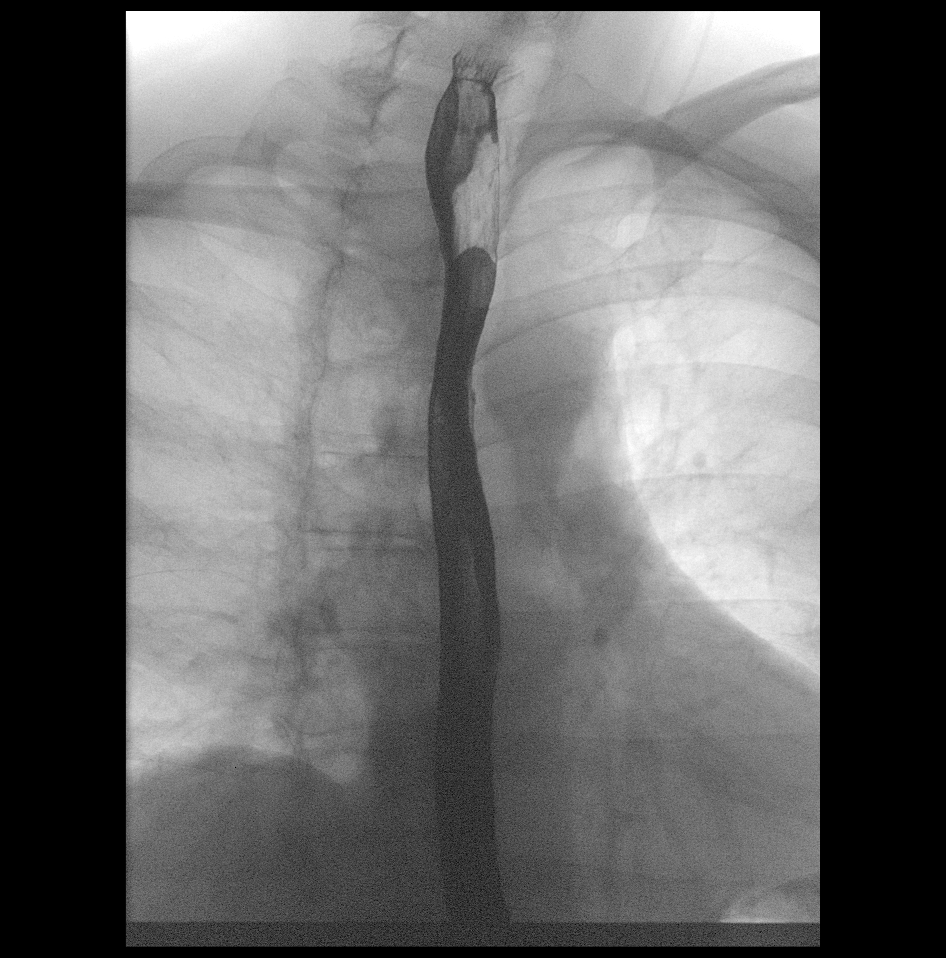

[Series 3: cp_standard · 0.25mm/px · 1 of 1 slices shown (2 of 7)]
[im 1/1]
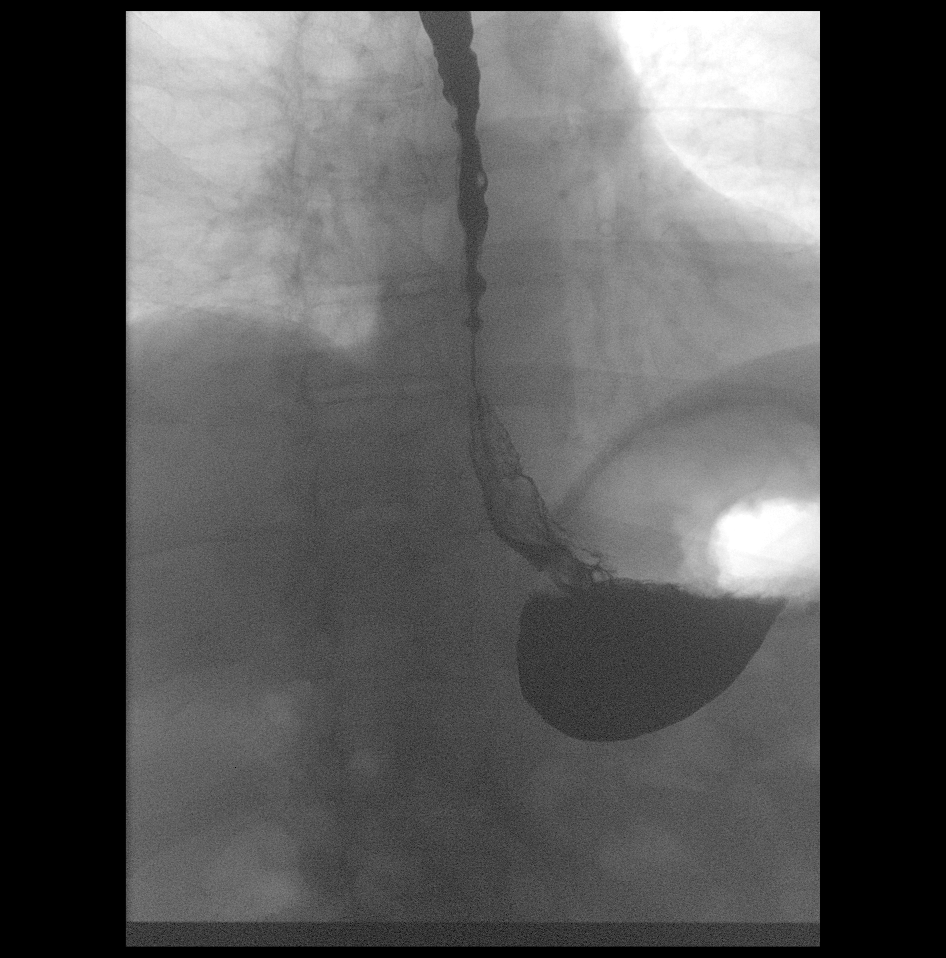

[Series 4: cp_standard · 0.51mm/px · 3 of 151 frames shown (3 of 7)]
[frame 23/151]
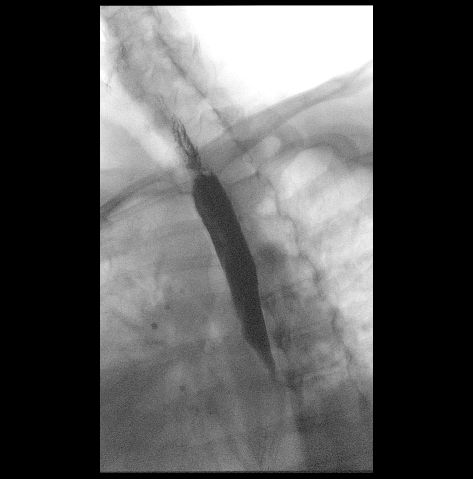
[frame 127/151]
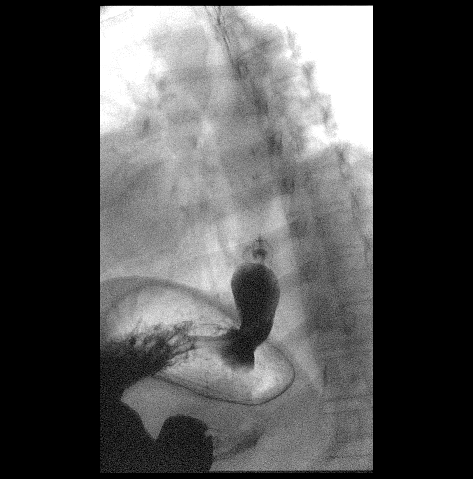
[frame 129/151]
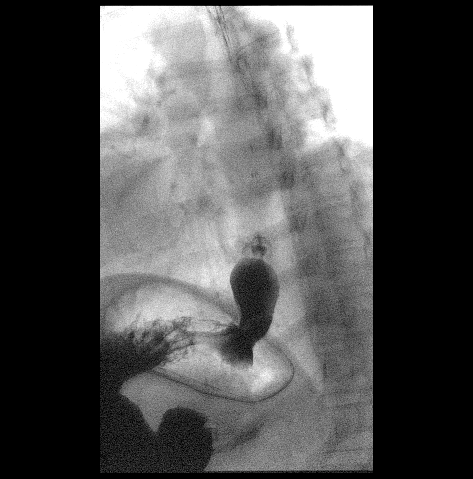

[Series 6: cp_standard · 0.51mm/px · 3 of 184 frames shown (4 of 7)]
[frame 28/184]
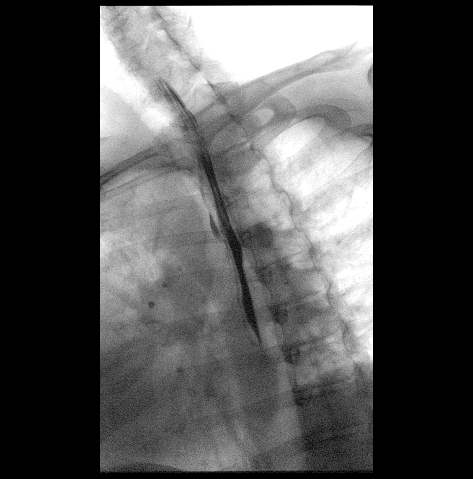
[frame 93/184]
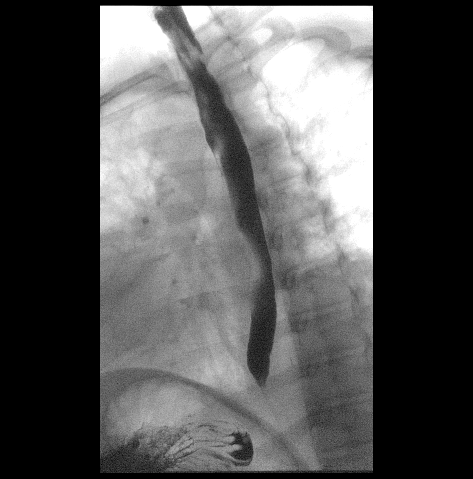
[frame 183/184]
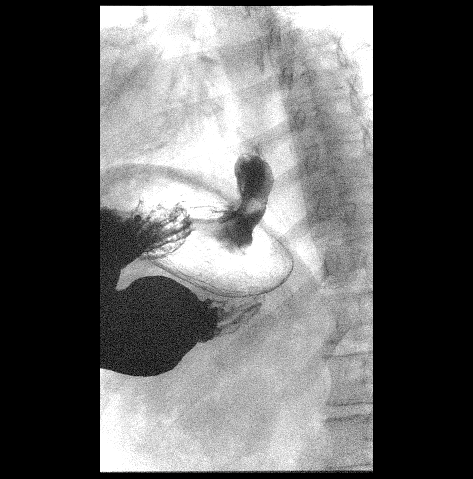

[Series 7: cp_standard · 0.51mm/px · 3 of 11 frames shown (5 of 7)]
[frame 2/11]
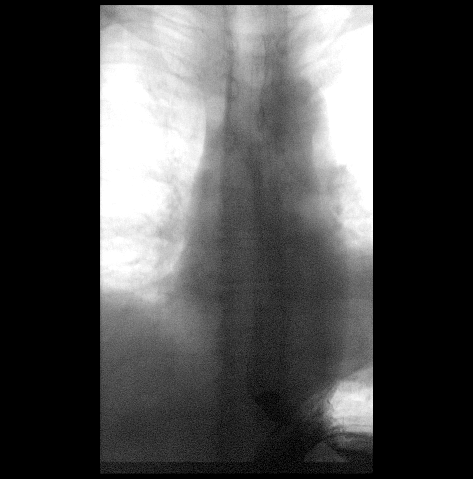
[frame 10/11]
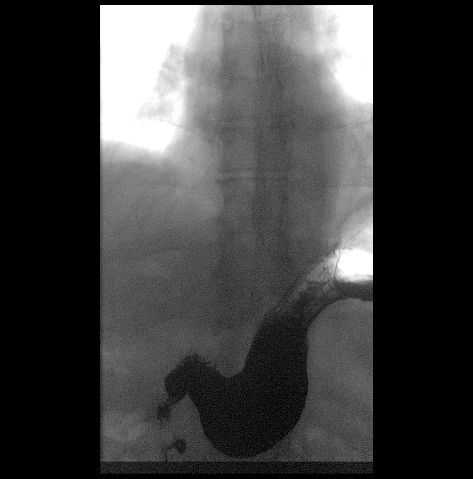
[frame 11/11]
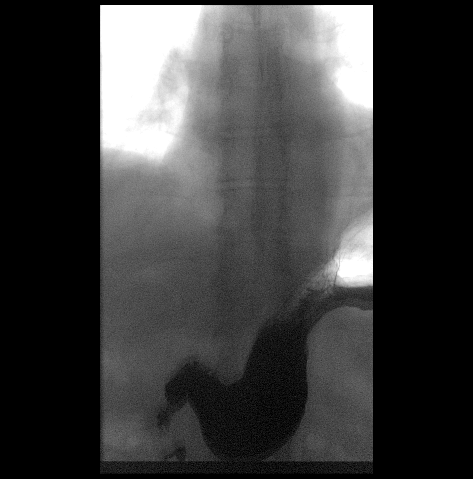

[Series 8: cp_standard · 0.51mm/px · 2 of 36 frames shown (6 of 7)]
[frame 15/36]
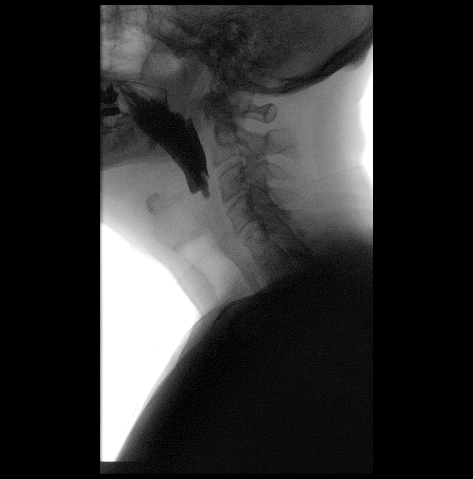
[frame 19/36]
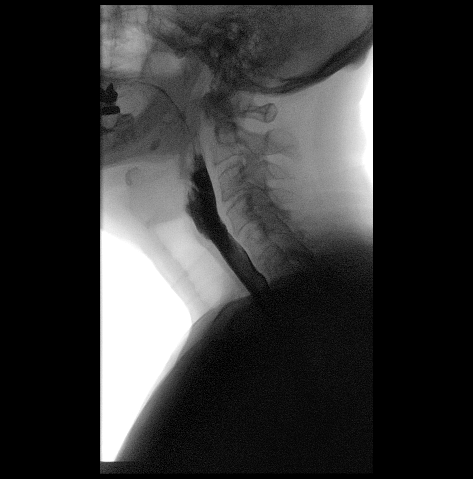

[Series 9: cp_standard · 0.51mm/px · 3 of 68 frames shown (7 of 7)]
[frame 11/68]
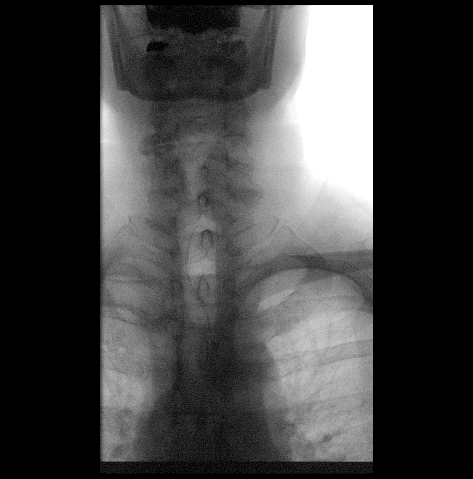
[frame 35/68]
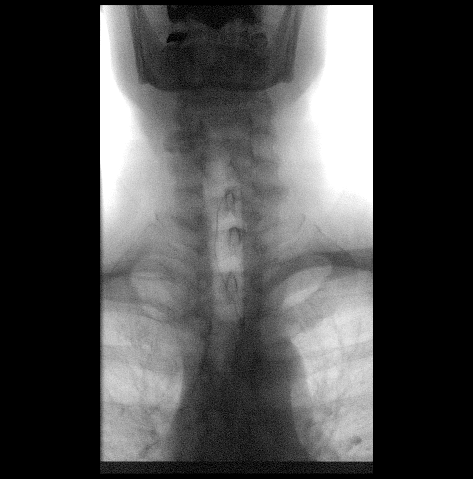
[frame 58/68]
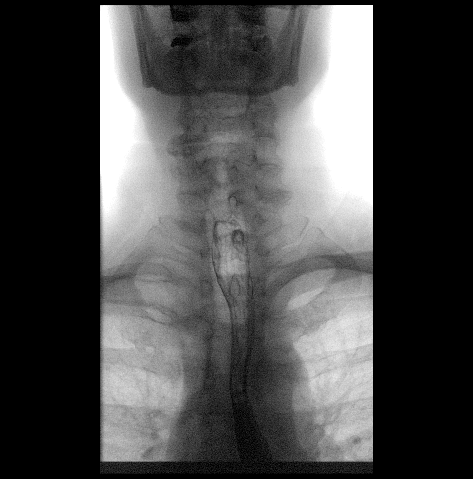

[16 of 24 positions shown; findings below may reference images not displayed]

FINDINGS: The swallowing mechanism is within normal limits. No signs of
aspiration.

The esophagus is patent. There is no stricture or mass identified.
13 mm barium tablet was ingested which easily passed through the
esophagus and into the stomach.

Evaluation of the motility of the esophagus shows mild dysmotility
with several non propulsive tertiary waves.

Small to medium size hiatal hernia noted.  No episodes of reflux.
IMPRESSION: 1. Hiatal hernia.
2. Mild esophageal dysmotility.
